# Patient Record
Sex: Male | Born: 1990 | Race: White | Hispanic: No | Marital: Single | Smoking: Never smoker
Health system: Southern US, Community
[De-identification: ages and names within clinical notes are randomized; demographics above are authoritative.]

## PROBLEM LIST (undated history)

## (undated) DIAGNOSIS — K5000 Crohn's disease of small intestine without complications: Secondary | ICD-10-CM

## (undated) DIAGNOSIS — F32A Depression, unspecified: Secondary | ICD-10-CM

## (undated) DIAGNOSIS — F419 Anxiety disorder, unspecified: Secondary | ICD-10-CM

## (undated) DIAGNOSIS — F502 Bulimia nervosa, unspecified: Secondary | ICD-10-CM

## (undated) DIAGNOSIS — F329 Major depressive disorder, single episode, unspecified: Secondary | ICD-10-CM

## (undated) DIAGNOSIS — D696 Thrombocytopenia, unspecified: Secondary | ICD-10-CM

## (undated) HISTORY — PX: COLONOSCOPY: SHX174

## (undated) HISTORY — DX: Bulimia nervosa: F50.2

## (undated) HISTORY — DX: Anxiety disorder, unspecified: F41.9

## (undated) HISTORY — PX: WISDOM TOOTH EXTRACTION: SHX21

## (undated) HISTORY — DX: Depression, unspecified: F32.A

## (undated) HISTORY — DX: Thrombocytopenia, unspecified: D69.6

## (undated) HISTORY — DX: Crohn's disease of small intestine without complications: K50.00

## (undated) HISTORY — DX: Major depressive disorder, single episode, unspecified: F32.9

## (undated) HISTORY — DX: Bulimia nervosa, unspecified: F50.20

---
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
## 2011-12-02 ENCOUNTER — Encounter: Payer: Self-pay | Admitting: Internal Medicine

## 2011-12-05 ENCOUNTER — Encounter: Payer: Self-pay | Admitting: Internal Medicine

## 2011-12-05 ENCOUNTER — Other Ambulatory Visit (INDEPENDENT_AMBULATORY_CARE_PROVIDER_SITE_OTHER): Payer: BC Managed Care – PPO

## 2011-12-05 ENCOUNTER — Ambulatory Visit (INDEPENDENT_AMBULATORY_CARE_PROVIDER_SITE_OTHER): Payer: BC Managed Care – PPO | Admitting: Internal Medicine

## 2011-12-05 VITALS — BP 132/80 | HR 92 | Ht 68.0 in | Wt 175.0 lb

## 2011-12-05 DIAGNOSIS — R188 Other ascites: Secondary | ICD-10-CM

## 2011-12-05 DIAGNOSIS — R197 Diarrhea, unspecified: Secondary | ICD-10-CM

## 2011-12-05 DIAGNOSIS — D72819 Decreased white blood cell count, unspecified: Secondary | ICD-10-CM

## 2011-12-05 DIAGNOSIS — R1033 Periumbilical pain: Secondary | ICD-10-CM

## 2011-12-05 DIAGNOSIS — K921 Melena: Secondary | ICD-10-CM

## 2011-12-05 DIAGNOSIS — L989 Disorder of the skin and subcutaneous tissue, unspecified: Secondary | ICD-10-CM

## 2011-12-05 LAB — SEDIMENTATION RATE: Sed Rate: 0 mm/hr (ref 0–22)

## 2011-12-05 LAB — COMPREHENSIVE METABOLIC PANEL
ALT: 21 U/L (ref 0–53)
AST: 25 U/L (ref 0–37)
Alkaline Phosphatase: 60 U/L (ref 39–117)
BUN: 17 mg/dL (ref 6–23)
Chloride: 100 mEq/L (ref 96–112)
Creatinine, Ser: 1.1 mg/dL (ref 0.4–1.5)

## 2011-12-05 LAB — C-REACTIVE PROTEIN: CRP: <0.5 mg/dL (ref 0.5–20.0)

## 2011-12-05 MED ORDER — NA SULFATE-K SULFATE-MG SULF 17.5-3.13-1.6 GM/177ML PO SOLN: 1.0000 | Freq: Once | ORAL | Status: DC

## 2011-12-05 MED ORDER — DICYCLOMINE HCL 20 MG PO TABS: 20.0000 mg | ORAL_TABLET | Freq: Four times a day (QID) | ORAL | Status: DC | PRN

## 2011-12-05 NOTE — Progress Notes (Signed)
Quick Note:  Let him know the labs look ok except for slight elevation in his bilirubin. Not sure what that means.  His abd/pelvic CT was not normal - had some mild abnormalities of unclear significance - ascites and a dilated blood vessel - IVC  1) Need to see if lab can fractionate the bilirubin 2) I need him to get a copy of CT from [place] and bring the disc to [place] 3) He needs a hematology consult re: leukopenia - within 1-2 weeks I hope (let me know) ______

## 2011-12-05 NOTE — Patient Instructions (Addendum)
Your physician has requested that you go to the basement for the following lab work before leaving today: CRP, Sed Rate,  CMET  We have sent the following medications to your pharmacy for you to pick up at your convenience: Generic Bentyl  You have been scheduled for a colonoscopy with propofol. Please follow written instructions given to you at your visit today.  Please use the suprep kit you have been given today. If you use inhalers (even only as needed), please bring them with you on the day of your procedure.  We have made you an appointment with Dr. Campbell Stall for Oct. 8th at 11:45am, please arrive 15-67mins. early to do paperwork. Her address is [address] , [address].  Phone # [phone]  We will obtain records from [facility] that you signed the ROI for today.  Thank you for choosing me and [facility].  Iva Boop, M.D., [facility]
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
## 2011-12-06 ENCOUNTER — Other Ambulatory Visit: Payer: Self-pay

## 2011-12-06 DIAGNOSIS — R7402 Elevation of levels of lactic acid dehydrogenase (LDH): Secondary | ICD-10-CM

## 2011-12-06 DIAGNOSIS — R7401 Elevation of levels of liver transaminase levels: Secondary | ICD-10-CM

## 2011-12-06 DIAGNOSIS — D72819 Decreased white blood cell count, unspecified: Secondary | ICD-10-CM

## 2011-12-07 ENCOUNTER — Telehealth: Payer: Self-pay | Admitting: Medical Oncology

## 2011-12-07 ENCOUNTER — Telehealth: Payer: Self-pay | Admitting: Internal Medicine

## 2011-12-07 NOTE — Telephone Encounter (Signed)
Grandmother spoke to [place] and pt has been referred by Dr Elenore Paddy? Marland Kitchen Tiffany is waiting for pt to return her call . HE can only come in after 4pm. He was supposed to have colonsocopy on 9/23 but cancelled it due to work.

## 2011-12-07 NOTE — Telephone Encounter (Signed)
I had a voicemail also from the patient.  I have left him a message for call back to discuss all the above at [phone]

## 2011-12-07 NOTE — Telephone Encounter (Signed)
I spoke with the patient he needed to resschedle his colonoscopy.  He is rescheduled for 12/22/11 11:00.  He verbalized understanding of instruction change. He has heard from Oncology they are working on an appt

## 2011-12-13 ENCOUNTER — Other Ambulatory Visit: Payer: Self-pay

## 2011-12-13 ENCOUNTER — Other Ambulatory Visit (INDEPENDENT_AMBULATORY_CARE_PROVIDER_SITE_OTHER): Payer: BC Managed Care – PPO

## 2011-12-13 DIAGNOSIS — R7401 Elevation of levels of liver transaminase levels: Secondary | ICD-10-CM

## 2011-12-13 LAB — BILIRUBIN, TOTAL: Total Bilirubin: 1.6 mg/dL — ABNORMAL HIGH (ref 0.3–1.2)

## 2011-12-14 LAB — BILIRUBIN, TOTAL: Total Bilirubin: 1.2 mg/dL (ref 0.3–1.2)

## 2011-12-14 LAB — BILIRUBIN,DIRECT & INDIRECT (FRACTIONATED): Indirect Bilirubin: 1 mg/dL — ABNORMAL HIGH (ref 0.0–0.9)

## 2011-12-19 ENCOUNTER — Encounter: Payer: BC Managed Care – PPO | Admitting: Internal Medicine

## 2011-12-21 ENCOUNTER — Telehealth: Payer: Self-pay | Admitting: Internal Medicine

## 2011-12-21 NOTE — Telephone Encounter (Signed)
Discussed with Dr. Leone Payor .  He wants him to keep the appt for tomorrow for colonoscopy.  The patient is advised

## 2011-12-21 NOTE — Telephone Encounter (Signed)
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  He notes that they were given antibiotics and their rash went away.  He now reports that his has healed without antibiotics and he no longer has any abdominal pain.  He is advised I will call back once I can speak with Dr. Leone Payor

## 2011-12-22 ENCOUNTER — Encounter: Payer: Self-pay | Admitting: Internal Medicine

## 2011-12-22 ENCOUNTER — Ambulatory Visit (AMBULATORY_SURGERY_CENTER): Payer: BC Managed Care – PPO | Admitting: Internal Medicine

## 2011-12-22 VITALS — BP 122/79 | HR 37 | Temp 96.5°F | Resp 20 | Ht 68.0 in | Wt 175.0 lb

## 2011-12-22 DIAGNOSIS — K633 Ulcer of intestine: Secondary | ICD-10-CM

## 2011-12-22 DIAGNOSIS — K5090 Crohn's disease, unspecified, without complications: Secondary | ICD-10-CM

## 2011-12-22 DIAGNOSIS — K6289 Other specified diseases of anus and rectum: Secondary | ICD-10-CM

## 2011-12-22 DIAGNOSIS — K921 Melena: Secondary | ICD-10-CM

## 2011-12-22 MED ORDER — SODIUM CHLORIDE 0.9 % IV SOLN: 500.0000 mL | INTRAVENOUS | Status: DC

## 2011-12-22 NOTE — Op Note (Signed)
[facility] [address] [place], [address]   COLONOSCOPY PROCEDURE REPORT  PATIENT: Cesar Carlson, Cesar Carlson  MR#: [id number] BIRTHDATE: 1990/04/23 , 21  yrs. old GENDER: Male ENDOSCOPIST: Iva Boop, MD, [facility] REFERRED XB:JYNWG Spear, M.D. PROCEDURE DATE:  12/22/2011 PROCEDURE:   Colonoscopy with biopsy ASA CLASS:   Class I INDICATIONS:hematochezia.   periumbilical abdoinal pain MEDICATIONS: Propofol (Diprivan) 240 mg IV, MAC sedation, administered by CRNA, and These medications were titrated to patient response per physician's verbal order  DESCRIPTION OF PROCEDURE:   After the risks benefits and alternatives of the procedure were thoroughly explained, informed consent was obtained.  A digital rectal exam revealed no abnormalities of the rectum.   The LB [id number]  endoscope was introduced through the anus and advanced to the terminal ileum which was intubated for a short distance. No adverse events experienced.   The quality of the prep was Suprep excellent  The instrument was then slowly withdrawn as the colon was fully examined.      COLON FINDINGS: A small ulcer was found in the terminal ileum.  A biopsy was performed using cold forceps.   Mild erythematous was found in the terminal ileum.  A biopsy was performed using cold forceps.   Moderate erythematous was found in the rectum.  Multiple biopsies were performed using cold forceps.   The colon mucosa was otherwise normal.  Retroflexed views revealed no abnormalities. The time to cecum=2 minutes 22 seconds.  Withdrawal time=10 minutes 01 seconds.  The scope was withdrawn and the procedure completed. COMPLICATIONS: There were no complications.  ENDOSCOPIC IMPRESSION: 1.   Tiny ulcer in the terminal ileum; biopsy was performed using cold forceps 2.   Mild erythematous mucosa was found in the terminal ileum; biopsy was performed using cold forceps 3.   Moderate erythematous mucosa was found in the  rectum; ? proctitis;multiple biopsies were performed using cold forceps 4.   The colon mucosa was otherwise normal with excellent prep  RECOMMENDATIONS: 1.  await biopsy results 2.  Office will call with the results. Subtle findings here could be prep -related. IBD possible but not much change if so.   eSigned:  Iva Boop, MD, [facility] 12/22/2011 11:23 AM   cc: Herb Grays, MD and The Patient

## 2011-12-22 NOTE — Patient Instructions (Addendum)
There were two areas of possible inflammation - one in the end of the small intestine (terminal ileum) and in the rectum. These could also be irritation from the preparation medication. I took biopsies to better understand and will call with the results.  Thank you for choosing me and [facility].  Iva Boop, MD, FACG  YOU HAD AN ENDOSCOPIC PROCEDURE TODAY AT THE [facility]: Refer to the procedure report that was given to you for any specific questions about what was found during the examination.  If the procedure report does not answer your questions, please call your gastroenterologist to clarify.  If you requested that your care partner not be given the details of your procedure findings, then the procedure report has been included in a sealed envelope for you to review at your convenience later.  YOU SHOULD EXPECT: Some feelings of bloating in the abdomen. Passage of more gas than usual.  Walking can help get rid of the air that was put into your GI tract during the procedure and reduce the bloating. If you had a lower endoscopy (such as a colonoscopy or flexible sigmoidoscopy) you may notice spotting of blood in your stool or on the toilet paper. If you underwent a bowel prep for your procedure, then you may not have a normal bowel movement for a few days.  DIET: Your first meal following the procedure should be a light meal and then it is ok to progress to your normal diet.  A half-sandwich or bowl of soup is an example of a good first meal.  Heavy or fried foods are harder to digest and may make you feel nauseous or bloated.  Likewise meals heavy in dairy and vegetables can cause extra gas to form and this can also increase the bloating.  Drink plenty of fluids but you should avoid alcoholic beverages for 24 hours.  ACTIVITY: Your care partner should take you home directly after the procedure.  You should plan to take it easy, moving slowly for the rest of the day.   You can resume normal activity the day after the procedure however you should NOT DRIVE or use heavy machinery for 24 hours (because of the sedation medicines used during the test).    SYMPTOMS TO REPORT IMMEDIATELY: A gastroenterologist can be reached at any hour.  During normal business hours, 8:30 AM to 5:00 PM Monday through Friday, call [phone].  After hours and on weekends, please call the GI answering service at [phone] who will take a message and have the physician on call contact you.   Following lower endoscopy (colonoscopy or flexible sigmoidoscopy):  Excessive amounts of blood in the stool  Significant tenderness or worsening of abdominal pains  Swelling of the abdomen that is new, acute  Fever of 100F or higher  Following upper endoscopy (EGD)  Vomiting of blood or coffee ground material  New chest pain or pain under the shoulder blades  Painful or persistently difficult swallowing  New shortness of breath  Fever of 100F or higher  Black, tarry-looking stools  FOLLOW UP: If any biopsies were taken you will be contacted by phone or by letter within the next 1-3 weeks.  Call your gastroenterologist if you have not heard about the biopsies in 3 weeks.  Our staff will call the home number listed on your records the next business day following your procedure to check on you and address any questions or concerns that you may have at that  time regarding the information given to you following your procedure. This is a courtesy call and so if there is no answer at the home number and we have not heard from you through the emergency physician on call, we will assume that you have returned to your regular daily activities without incident.  SIGNATURES/CONFIDENTIALITY: You and/or your care partner have signed paperwork which will be entered into your electronic medical record.  These signatures attest to the fact that that the information above on your After Visit Summary  has been reviewed and is understood.  Full responsibility of the confidentiality of this discharge information lies with you and/or your care-partner.

## 2011-12-22 NOTE — Progress Notes (Signed)
Patient did not experience any of the following events: a burn prior to discharge; a fall within the facility; wrong site/side/patient/procedure/implant event; or a hospital transfer or hospital admission upon discharge from the facility. (G8907) Patient did not have preoperative order for IV antibiotic SSI prophylaxis. (G8918)  
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
## 2011-12-23 ENCOUNTER — Telehealth: Payer: Self-pay

## 2011-12-23 NOTE — Telephone Encounter (Signed)
Left message on answering machine. 

## 2011-12-30 ENCOUNTER — Other Ambulatory Visit: Payer: Self-pay

## 2011-12-30 DIAGNOSIS — D696 Thrombocytopenia, unspecified: Secondary | ICD-10-CM

## 2012-01-03 ENCOUNTER — Ambulatory Visit: Payer: Self-pay | Admitting: Internal Medicine

## 2012-01-30 ENCOUNTER — Ambulatory Visit: Payer: BC Managed Care – PPO | Admitting: Internal Medicine

## 2012-12-28 ENCOUNTER — Ambulatory Visit (HOSPITAL_COMMUNITY)
Admission: RE | Admit: 2012-12-28 | Discharge: 2012-12-28 | Disposition: A | Discharge status: Home / Self Care | Payer: BC Managed Care – PPO | Source: Ambulatory Visit | Attending: *Deleted | Admitting: *Deleted

## 2012-12-28 ENCOUNTER — Encounter (HOSPITAL_COMMUNITY): Payer: Self-pay

## 2012-12-28 ENCOUNTER — Other Ambulatory Visit (HOSPITAL_COMMUNITY): Payer: Self-pay | Admitting: *Deleted

## 2012-12-28 DIAGNOSIS — R1011 Right upper quadrant pain: Secondary | ICD-10-CM | POA: Insufficient documentation

## 2012-12-28 MED ORDER — IOHEXOL 300 MG/ML  SOLN
100.0000 mL | Freq: Once | INTRAMUSCULAR | Status: AC | PRN
  Administered 2012-12-28: 100 mL via INTRAVENOUS
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
## 2015-04-15 ENCOUNTER — Other Ambulatory Visit: Payer: Self-pay | Admitting: Otolaryngology

## 2015-04-29 ENCOUNTER — Ambulatory Visit (HOSPITAL_COMMUNITY): Payer: Self-pay | Admitting: Psychiatry

## 2016-03-07 ENCOUNTER — Encounter: Payer: Self-pay | Admitting: Physician Assistant

## 2016-03-07 ENCOUNTER — Telehealth: Payer: Self-pay | Admitting: Physician Assistant

## 2016-03-07 ENCOUNTER — Other Ambulatory Visit (INDEPENDENT_AMBULATORY_CARE_PROVIDER_SITE_OTHER): Payer: PRIVATE HEALTH INSURANCE

## 2016-03-07 ENCOUNTER — Ambulatory Visit (INDEPENDENT_AMBULATORY_CARE_PROVIDER_SITE_OTHER): Payer: PRIVATE HEALTH INSURANCE | Admitting: Physician Assistant

## 2016-03-07 VITALS — BP 140/88 | HR 68 | Ht 68.0 in | Wt 186.0 lb

## 2016-03-07 DIAGNOSIS — R197 Diarrhea, unspecified: Secondary | ICD-10-CM | POA: Diagnosis not present

## 2016-03-07 DIAGNOSIS — K50011 Crohn's disease of small intestine with rectal bleeding: Secondary | ICD-10-CM

## 2016-03-07 DIAGNOSIS — R1084 Generalized abdominal pain: Secondary | ICD-10-CM | POA: Diagnosis not present

## 2016-03-07 DIAGNOSIS — K625 Hemorrhage of anus and rectum: Secondary | ICD-10-CM

## 2016-03-07 LAB — CBC WITH DIFFERENTIAL/PLATELET
BASOS PCT: 0.6 % (ref 0.0–3.0)
Basophils Absolute: 0 10*3/uL (ref 0.0–0.1)
EOS PCT: 5.2 % — AB (ref 0.0–5.0)
Eosinophils Absolute: 0.2 10*3/uL (ref 0.0–0.7)
HCT: 46.3 % (ref 39.0–52.0)
Hemoglobin: 15.6 g/dL (ref 13.0–17.0)
LYMPHS ABS: 1.7 10*3/uL (ref 0.7–4.0)
Lymphocytes Relative: 45 % (ref 12.0–46.0)
MCHC: 33.8 g/dL (ref 30.0–36.0)
MCV: 84.7 fl (ref 78.0–100.0)
MONOS PCT: 7.3 % (ref 3.0–12.0)
Monocytes Absolute: 0.3 10*3/uL (ref 0.1–1.0)
NEUTROS ABS: 1.6 10*3/uL (ref 1.4–7.7)
Neutrophils Relative %: 41.9 % — ABNORMAL LOW (ref 43.0–77.0)
PLATELETS: 131 10*3/uL — AB (ref 150.0–400.0)
RBC: 5.46 Mil/uL (ref 4.22–5.81)
RDW: 14.1 % (ref 11.5–15.5)
WBC: 3.8 10*3/uL — ABNORMAL LOW (ref 4.0–10.5)

## 2016-03-07 LAB — COMPREHENSIVE METABOLIC PANEL
ALK PHOS: 48 U/L (ref 39–117)
ALT: 17 U/L (ref 0–53)
AST: 17 U/L (ref 0–37)
Albumin: 4.5 g/dL (ref 3.5–5.2)
BUN: 13 mg/dL (ref 6–23)
CO2: 27 mEq/L (ref 19–32)
Calcium: 9.7 mg/dL (ref 8.4–10.5)
Chloride: 102 mEq/L (ref 96–112)
Creatinine, Ser: 1 mg/dL (ref 0.40–1.50)
GFR: 95 mL/min (ref >60.00)
GLUCOSE: 100 mg/dL — AB (ref 70–99)
POTASSIUM: 4.7 meq/L (ref 3.5–5.1)
Sodium: 137 mEq/L (ref 135–145)
TOTAL PROTEIN: 7.1 g/dL (ref 6.0–8.3)
Total Bilirubin: 1.2 mg/dL (ref 0.2–1.2)

## 2016-03-07 LAB — VITAMIN B12: VITAMIN B 12: 324 pg/mL (ref 211–911)

## 2016-03-07 LAB — FOLATE: FOLATE: 8.6 ng/mL (ref >5.9)

## 2016-03-07 LAB — HIGH SENSITIVITY CRP: CRP, High Sensitivity: 0.15 mg/L (ref 0.000–5.000)

## 2016-03-07 LAB — SEDIMENTATION RATE: Sed Rate: 1 mm/hr (ref 0–15)

## 2016-03-07 MED ORDER — BUDESONIDE 3 MG PO CPEP: ORAL_CAPSULE | ORAL | 2 refills | Status: DC

## 2016-03-07 MED ORDER — GLYCOPYRROLATE 2 MG PO TABS: 2.0000 mg | ORAL_TABLET | Freq: Two times a day (BID) | ORAL | 6 refills | Status: DC

## 2016-03-07 NOTE — Telephone Encounter (Signed)
[facility] [address] Asked them if I could do a prior authorization for the Budesonide ( Entocort) . The pharmacist said no prior authorization is needed. The price the patient has to pay after insurance filed is $1000.00.  Per Mike GipAmy Esterwood PA, she asked me to asked the patient if he could afford 14 days of the Robinul Forte 2 mg which would be $35.00.  He said he could pick that up at the pharmacy.  Amy is talking to Dr. Leone PayorGessner about possibly the patient taking Prednisone.  We will get back to the patient .

## 2016-03-07 NOTE — Progress Notes (Signed)
Subjective:    Patient ID: Cesar Carlson, male    DOB: 10/25/1990, 25 y.o.   MRN: [id number]  HPI Cesar Carlson is a pleasant [age] year old male previously known to Dr. Leone PayorGessner who is referred today by Dr. Thornton PapasJeffrey Anderson. Patient was last seen here in September 2013 at which time he had undergone colonoscopy for complaints of mid abdominal pain. He was found to have tiny ulcers and erythema in the terminal ileum and moderate erythema in the rectum. Biopsies of the TI showed an active ileitis with granulomas, consistent with Crohn's disease and rectal biopsies were benign. Patient did not come back in for follow-up and has not been seen in our office since. Comes in today stating that he had lost his insurance for a few years and has been having some ongoing mild symptoms. Feels that perhaps his abdominal discomfort has been somewhat worse over the past 6 months. He says he usually always has diarrhea and rarely has a formed bowel movement. Generally having 3-4 bowel movements per day and see small amounts of bright red blood mixed with his bowel movements intermittently. He says he has to get up early in the morning because he needs at least an hour in the bathroom prior to going to work and may have abdominal cramping and several bowel movements. Appendectomy says he always feels best with his system decompressed. Appetite Has Been Fine and Weight Has Been Stable. Scrubs Intermittent Lower Abdominal Pain and Cramping, No Fever or Chills, No Arthralgias. He Also Mentions That He Has a Diagnosis of Bulimia and Depression. He Had Been Seen Counselor but Is Not Currently Seeing Anyone. He Says Most of the Time He Has the Bulimia under Control but Occasionally Will Have "Episodes."  Review of Systems Pertinent positive and negative review of systems were noted in the above HPI section.  All other review of systems was otherwise negative.   Outpatient Encounter Prescriptions as of 03/07/2016  Medication Sig  .  budesonide (ENTOCORT EC) 3 MG 24 hr capsule Take 3 pills each morning.  Marland Kitchen. glycopyrrolate (ROBINUL) 2 MG tablet Take 1 tablet (2 mg total) by mouth 2 (two) times daily.  . [DISCONTINUED] dicyclomine (BENTYL) 20 MG tablet Take 1 tablet (20 mg total) by mouth every 6 (six) hours as needed (abdominal pain).   No facility-administered encounter medications on file as of 03/07/2016.    No Known Allergies Patient Active Problem List   Diagnosis Date Noted  . Leukopenia 12/05/2011  . Blood in stool 12/05/2011  . Periumbilical abdominal pain 12/05/2011  . Skin lesions, generalized 12/05/2011   Social History   Social History  . Marital status: Single    Spouse name: N/A  . Number of children: 0  . Years of education: N/A   Occupational History  . Location managerMachine Operator     Social History Main Topics  . Smoking status: Never Smoker  . Smokeless tobacco: Never Used  . Alcohol use Yes     Comment: rare  . Drug use: No  . Sexual activity: Not on file   Other Topics Concern  . Not on file   Social History Narrative  . No narrative on file    Cesar Carlson's family history includes Colon cancer in his maternal grandfather.      Objective:    Vitals:   03/07/16 1046  BP: 140/88  Pulse: 68    Physical Exam  well-developed young male in no acute distress, pleasant blood pressure 140/88 pulse 68, height 5  foot 8, weight 186 BMI 28.2. HEENT ;nontraumatic normocephalic EOMI PERRLA sclera anicteric, Cardiovascular; regular rate and rhythm with S1-S2 no murmur or gallop, Pulmonary ;clear bilaterally, Abdomen ;soft and some mild tenderness in the right lower quadrant there is no guarding or rebound no palpable mass or hepatosplenomegaly bowel sounds are present Rectal ;exam not done, Extremities ;no clubbing cyanosis or edema skin warm and dry, Neuropsych; mood and affect appropriate       Assessment & Plan:   #291 25 year old male with Crohn's ileitis diagnosed September 2013, no medical  therapy since diagnosis now presenting with ongoing intermittent abdominal pain and cramping, moderate diarrhea and intermittent hematochezia. Symptoms are consistent with active Crohn's disease #2 history of bulimia  #3 history of depression  Plan; Will obtain baseline labs, to include CBC, CMET,sedimentation rate CRP Start Entocort 3 mg, 3 tablets by mouth every morning Start Robinul Forte 2 mg by mouth twice a day Patient inquires about a probiotic and Culturelle is suggested 1-2 daily He'll follow-up with Dr. Leone PayorGessner in one month, and depending on response to above can decide on indication for further imaging and/or repeat colonoscopy.   Amy S Esterwood PA-C 03/07/2016   Cc: No ref. provider found

## 2016-03-07 NOTE — Patient Instructions (Addendum)
Please go to the basement level to have your labs drawn.   [facility] [address] 1. Entocort 3 mg 2. Robinul Forte 2 mg  Try Culturelle probiotic, over the counter. Take 1 twice daily.   Call us for any problems.   We made you an appointment with Dr. Stan Headarl Gessner for 04-11-2016 at 3:00 PM.

## 2016-03-08 ENCOUNTER — Other Ambulatory Visit: Payer: Self-pay

## 2016-03-08 MED ORDER — PREDNISONE 10 MG PO TABS: 20.0000 mg | ORAL_TABLET | Freq: Every day | ORAL | 0 refills | Status: DC

## 2016-03-08 NOTE — Progress Notes (Signed)
Agree with Ms. Oswald Hillocksterwood's assessment and plan. Iva Booparl E. Orma Cheetham, MD, [facility]   We will have to use prednisone at this time due to cost issues for entocort. Will need to think about biologic Tx vs trial of ASA would need to reassess small bowel first I think

## 2016-03-08 NOTE — Telephone Encounter (Signed)
-----   Message from Sammuel CooperAmy S Esterwood, PA-C sent at 03/08/2016 11:46 AM EST ----- Regarding: RE: Mardene SpeakMiles  Farnan Sounds good.[name].  Beth , please call pt and let him know that we cannot get Entocort preauthorized for him... Will start Predisone instead - 20 mg po qam x 2 weeks , then decreased to 15 mg daily x 2 weeks ,then decrease to 10 mg daily and stay at that dose until sees dr Leone PayorGessner for follow up appt   ----- Message ----- From: Iva Booparl E Gessner, MD Sent: 03/08/2016   8:31 AM To: Sammuel CooperAmy S Esterwood, PA-C Subject: RE: [name]                                Sure Taper down to 10 mg and stay til he sees me  Sound good? ----- Message ----- From: Sammuel CooperAmy S Esterwood, PA-C Sent: 03/07/2016   5:41 PM To: Iva Booparl E Gessner, MD Subject: Mardene SpeakMiles  Minor                                    Please review my note from today- his insurance will not cover Entocort well -his cost will be 1000.00 --- are you ok putting him on some prednisone?

## 2016-03-08 NOTE — Telephone Encounter (Signed)
Left a message for the patient to call me. [facility]

## 2016-03-09 NOTE — Telephone Encounter (Signed)
Patient is advised.  

## 2016-04-11 ENCOUNTER — Telehealth: Payer: Self-pay | Admitting: Internal Medicine

## 2016-04-11 ENCOUNTER — Ambulatory Visit: Payer: Self-pay | Admitting: Internal Medicine

## 2016-05-19 ENCOUNTER — Ambulatory Visit: Payer: Self-pay | Admitting: Internal Medicine

## 2016-06-09 ENCOUNTER — Other Ambulatory Visit (INDEPENDENT_AMBULATORY_CARE_PROVIDER_SITE_OTHER): Payer: PRIVATE HEALTH INSURANCE

## 2016-06-09 ENCOUNTER — Ambulatory Visit (INDEPENDENT_AMBULATORY_CARE_PROVIDER_SITE_OTHER): Payer: PRIVATE HEALTH INSURANCE | Admitting: Physician Assistant

## 2016-06-09 ENCOUNTER — Encounter: Payer: Self-pay | Admitting: Physician Assistant

## 2016-06-09 ENCOUNTER — Encounter (INDEPENDENT_AMBULATORY_CARE_PROVIDER_SITE_OTHER): Payer: Self-pay

## 2016-06-09 VITALS — BP 120/74 | HR 64 | Ht 68.0 in | Wt 186.2 lb

## 2016-06-09 DIAGNOSIS — K529 Noninfective gastroenteritis and colitis, unspecified: Secondary | ICD-10-CM

## 2016-06-09 DIAGNOSIS — K50018 Crohn's disease of small intestine with other complication: Secondary | ICD-10-CM | POA: Diagnosis not present

## 2016-06-09 LAB — C-REACTIVE PROTEIN: CRP: 0.1 mg/dL — AB (ref 0.5–20.0)

## 2016-06-09 LAB — CBC WITH DIFFERENTIAL/PLATELET
BASOS PCT: 1.5 % (ref 0.0–3.0)
Basophils Absolute: 0 10*3/uL (ref 0.0–0.1)
EOS ABS: 0.2 10*3/uL (ref 0.0–0.7)
EOS PCT: 8.4 % — AB (ref 0.0–5.0)
HCT: 47.4 % (ref 39.0–52.0)
HEMOGLOBIN: 15.6 g/dL (ref 13.0–17.0)
LYMPHS ABS: 1.5 10*3/uL (ref 0.7–4.0)
Lymphocytes Relative: 54.1 % — ABNORMAL HIGH (ref 12.0–46.0)
MCHC: 32.9 g/dL (ref 30.0–36.0)
MCV: 86.3 fl (ref 78.0–100.0)
MONO ABS: 0.3 10*3/uL (ref 0.1–1.0)
Monocytes Relative: 11.6 % (ref 3.0–12.0)
NEUTROS ABS: 0.7 10*3/uL — AB (ref 1.4–7.7)
Neutrophils Relative %: 24.4 % — ABNORMAL LOW (ref 43.0–77.0)
PLATELETS: 134 10*3/uL — AB (ref 150.0–400.0)
RBC: 5.49 Mil/uL (ref 4.22–5.81)
RDW: 14 % (ref 11.5–15.5)
WBC: 2.8 10*3/uL — AB (ref 4.0–10.5)

## 2016-06-09 LAB — MAGNESIUM: Magnesium: 2.2 mg/dL (ref 1.5–2.5)

## 2016-06-09 LAB — VITAMIN B12: Vitamin B-12: 338 pg/mL (ref 211–911)

## 2016-06-09 LAB — PHOSPHORUS: Phosphorus: 3.6 mg/dL (ref 2.3–4.6)

## 2016-06-09 MED ORDER — MESALAMINE ER 500 MG PO CPCR: 1000.0000 mg | ORAL_CAPSULE | Freq: Three times a day (TID) | ORAL | 6 refills | Status: DC

## 2016-06-09 NOTE — Patient Instructions (Addendum)
Your physician has requested that you go to the basement for the lab work before leaving today.   We have sent the following medications to your pharmacy for you to pick up at your convenience: Pentasa   The nutrition department will contact you about an appointment.    Please keep the April appointment with Dr Leone PayorGessner. (07-12-16 at 4:00pm)    I appreciate the opportunity to care for you.

## 2016-06-09 NOTE — Progress Notes (Addendum)
Subjective:    Patient ID: Cesar Carlson, male    DOB: 11/23/1990, 26 y.o.   MRN: [id number]  HPI Cesar Carlson is a 26 year old male, known to Dr. Leone Carlson who has diagnosis of Crohn's ileitis which was diagnosed in 2013. He had not been seen in our office for several years and repeat presented in December 2017 with complaints of abdominal cramping and chronic diarrhea. It was felt his symptoms were consistent with active Crohn's and he was started on Entocort 9 mg daily and Robinul forte. Labs were done which were unremarkable. Unfortunately his insurance would not cover Entocort and he was then given a tapered course of prednisone starting at 20 mg daily 2 weeks and then tapering every 2 weeks until done. He was to follow-up with Dr. Leone Carlson but canceled that appointment. He comes in today stating that she did not feel that the prednisone really made any difference in his symptoms. He was unable to tolerate the Robinul Forte because of dry mouth and some dizziness. He feels about the same, no worse. He has chronic diarrhea with 3-4 bowel movements per day, states he occasionally sees a small amount of blood in his stool. He has intermittent abdominal cramping. He says that he's been having more muscle cramping after his workouts recently and also has had an increase in migraines. He is wondering about vitamin deficiencies. He is competitive in Cytogeneticistmartial arts, denies any megadose vitamins supplements etc.  Review of Systems Pertinent positive and negative review of systems were noted in the above HPI section.  All other review of systems was otherwise negative.  Outpatient Encounter Prescriptions as of 06/09/2016  Medication Sig  . glycopyrrolate (ROBINUL) 2 MG tablet Take 1 tablet (2 mg total) by mouth 2 (two) times daily. (Patient not taking: Reported on 06/09/2016)  . mesalamine (PENTASA) 500 MG CR capsule Take 2 capsules (1,000 mg total) by mouth 3 (three) times daily.  . [DISCONTINUED] predniSONE  (DELTASONE) 10 MG tablet Take 2 tablets (20 mg total) by mouth daily with breakfast. And as directed by your GI doctor   No facility-administered encounter medications on file as of 06/09/2016.    No Known Allergies Patient Active Problem List   Diagnosis Date Noted  . Leukopenia 12/05/2011  . Blood in stool 12/05/2011  . Periumbilical abdominal pain 12/05/2011  . Skin lesions, generalized 12/05/2011   Social History   Social History  . Marital status: Single    Spouse name: N/A  . Number of children: 0  . Years of education: N/A   Occupational History  . Location managerMachine Operator     Social History Main Topics  . Smoking status: Never Smoker  . Smokeless tobacco: Never Used  . Alcohol use Yes     Comment: rare  . Drug use: No  . Sexual activity: Not on file   Other Topics Concern  . Not on file   Social History Narrative  . No narrative on file    Mr. Cesar HarperBell's family history includes Colon cancer in his maternal grandfather.      Objective:    Vitals:   06/09/16 1313  BP: 120/74  Pulse: 64    Physical Exam  well-developed young male in no acute distress, pressure 120/74 pulse 64, height 5 foot 8, weight 186, BMI 28.3. HEENT; nontraumatic, cephalic EOMI PERRLA sclera anicteric, Cardiovascular; regular rate and rhythm with S1-S2 no murmur or gallop, Pulmonary; clear bilaterally, Abdomen; soft nontender nondistended bowel sounds are active there is no palpable mass  or hepatosplenomegaly, Rectal; exam not done, Ext; no clubbing cyanosis or edema skin warm and dry, Neuropsych; mood and affect appropriate       Assessment & Plan:   #30 26 year old male with history of Crohn's ileitis documented on colonoscopy 2013. Ongoing mild symptoms with chronic diarrhea 3-4 times daily intermittent abdominal cramping occasional small-volume hematochezia. Minimal if any response to recent course of prednisone 20 mg daily. Unable to get Entocort authorized. Intolerant to Robinul  Forte  #2 history of bulimia  Plan; CBC with differential, sedimentation rate, CRP, B12 level magnesium Give him a trial of Pentasa 500 mg 2 by mouth 3 times a day. I suspect  He is  going to wind up needing another colonoscopy soon to redocument extent of disease and then decide about initiating biologic therapy. His symptoms are mild to moderate and have not progressed. We'll have him follow-up with Dr. Leone Payor in 3-4 weeks.  Cesar Sitter Oswald Hillock PA-C 06/09/2016   Agree with Ms. Oswald Carlson assessment and plan. Cesar Boop, MD, Cesar Carlson

## 2016-06-14 NOTE — Progress Notes (Signed)
Neutropenia may be due to race - is seen in blacks but not aware that tpenia is so I would have him see a hematologist to look over things

## 2016-06-15 ENCOUNTER — Other Ambulatory Visit: Payer: Self-pay

## 2016-06-15 DIAGNOSIS — D708 Other neutropenia: Secondary | ICD-10-CM

## 2016-06-30 ENCOUNTER — Encounter: Payer: Self-pay | Admitting: Hematology

## 2016-06-30 ENCOUNTER — Telehealth: Payer: Self-pay | Admitting: Hematology

## 2016-06-30 NOTE — Telephone Encounter (Signed)
Tc to the pt to schedule a hem appt. Appt has been scheduled for the pt to see Dr. Candise Che on 5/17 at 3pm. Pt stated that he needed the latest appt time. Demographics verified. Letter mailed to the pt.

## 2016-07-04 ENCOUNTER — Telehealth: Payer: Self-pay

## 2016-07-04 ENCOUNTER — Telehealth: Payer: Self-pay | Admitting: Internal Medicine

## 2016-07-04 MED ORDER — MESALAMINE ER 500 MG PO CPCR: 1000.0000 mg | ORAL_CAPSULE | Freq: Three times a day (TID) | ORAL | 3 refills | Status: AC

## 2016-07-04 MED ORDER — GLYCOPYRROLATE 2 MG PO TABS: 2.0000 mg | ORAL_TABLET | Freq: Two times a day (BID) | ORAL | 3 refills | Status: DC

## 2016-07-04 NOTE — Telephone Encounter (Signed)
Filled out [employer] assistance form and mailed it along with printed rx's for both the pentasa and glycopyrrolate to : [employer] [address], [place] , [place] [address].  Form sent to be scanned into epic.

## 2016-07-04 NOTE — Telephone Encounter (Signed)
A user error has taken place.

## 2016-07-05 ENCOUNTER — Ambulatory Visit: Payer: PRIVATE HEALTH INSURANCE | Admitting: Internal Medicine

## 2016-07-12 ENCOUNTER — Ambulatory Visit: Payer: PRIVATE HEALTH INSURANCE | Admitting: Internal Medicine

## 2016-08-08 ENCOUNTER — Ambulatory Visit: Payer: PRIVATE HEALTH INSURANCE | Admitting: Internal Medicine

## 2016-08-09 ENCOUNTER — Telehealth: Payer: Self-pay | Admitting: *Deleted

## 2016-08-09 NOTE — Telephone Encounter (Signed)
Patient called to cancel appointment with no reschedule. Appt cancelled.

## 2016-08-11 ENCOUNTER — Encounter: Payer: PRIVATE HEALTH INSURANCE | Admitting: Hematology

## 2016-08-19 ENCOUNTER — Encounter (INDEPENDENT_AMBULATORY_CARE_PROVIDER_SITE_OTHER): Payer: Self-pay

## 2016-08-19 ENCOUNTER — Encounter: Payer: Self-pay | Admitting: Internal Medicine

## 2016-08-19 ENCOUNTER — Ambulatory Visit (INDEPENDENT_AMBULATORY_CARE_PROVIDER_SITE_OTHER): Payer: PRIVATE HEALTH INSURANCE | Admitting: Internal Medicine

## 2016-08-19 ENCOUNTER — Other Ambulatory Visit (INDEPENDENT_AMBULATORY_CARE_PROVIDER_SITE_OTHER): Payer: PRIVATE HEALTH INSURANCE

## 2016-08-19 VITALS — BP 112/60 | HR 64 | Ht 67.32 in | Wt 187.4 lb

## 2016-08-19 DIAGNOSIS — D696 Thrombocytopenia, unspecified: Secondary | ICD-10-CM

## 2016-08-19 DIAGNOSIS — D709 Neutropenia, unspecified: Secondary | ICD-10-CM | POA: Diagnosis not present

## 2016-08-19 DIAGNOSIS — K50011 Crohn's disease of small intestine with rectal bleeding: Secondary | ICD-10-CM | POA: Diagnosis not present

## 2016-08-19 LAB — CBC WITH DIFFERENTIAL/PLATELET
BASOS PCT: 1.2 % (ref 0.0–3.0)
Basophils Absolute: 0 10*3/uL (ref 0.0–0.1)
EOS ABS: 0.3 10*3/uL (ref 0.0–0.7)
Eosinophils Relative: 10.2 % — ABNORMAL HIGH (ref 0.0–5.0)
HEMATOCRIT: 44.8 % (ref 39.0–52.0)
HEMOGLOBIN: 14.8 g/dL (ref 13.0–17.0)
LYMPHS PCT: 59.5 % — AB (ref 12.0–46.0)
Lymphs Abs: 1.8 10*3/uL (ref 0.7–4.0)
MCHC: 33 g/dL (ref 30.0–36.0)
MCV: 85.5 fl (ref 78.0–100.0)
MONOS PCT: 10.6 % (ref 3.0–12.0)
Monocytes Absolute: 0.3 10*3/uL (ref 0.1–1.0)
NEUTROS ABS: 0.6 10*3/uL — AB (ref 1.4–7.7)
Neutrophils Relative %: 18.5 % — ABNORMAL LOW (ref 43.0–77.0)
PLATELETS: 131 10*3/uL — AB (ref 150.0–400.0)
RBC: 5.23 Mil/uL (ref 4.22–5.81)
RDW: 14 % (ref 11.5–15.5)
WBC: 3 10*3/uL — AB (ref 4.0–10.5)

## 2016-08-19 MED ORDER — DICYCLOMINE HCL 20 MG PO TABS: 20.0000 mg | ORAL_TABLET | Freq: Three times a day (TID) | ORAL | 3 refills | Status: DC

## 2016-08-19 NOTE — Progress Notes (Signed)
   Cesar Carlson 25 y.o. Aug 22, 1990 [id number]  Assessment & Plan:   Encounter Diagnoses  Name Primary?  . Crohn's disease of small intestine with rectal bleeding (HCC) Yes  . Neutropenia, unspecified type (HCC)   . Thrombocytopenia (HCC)      CBC, Pathologist review smear to evaluate neutropenia and thrombocytopenia Stay on mesalamine 2 mo f/u Low fiber diet for symptom relief Dicyclomine 20 mg 3 times a day before meals and at bedtime instead of glycopyrrolate  If he fails to significantly improved could need other imaging like enterography versus capsule endoscopy. Could consider repeating a colonoscopy though might go with a capsule endoscope instead. Will also consider budesonide.  Subjective:   Chief Complaint: Diarrhea  HPI Cesar Carlson is here for follow-up after reestablishing care in December 2017.Marland Kitchen. Long absence without follow-up, he had Crohn's ileitis diagnosed at colonoscopy in 2013. He was started on Pentasa at 1000 mg 3 times a day but he is only been on that about a month because he had to get assistance because of the cost. He essentially says his about the same as he was when he presented in March. Does not seem to be having nocturnal symptoms. He had thrombocytopenia and leukopenia which were mild and was referred to hematology but did not go  Medications, allergies, past medical history, past surgical history, family history and social history are reviewed and updated in the EMR.  Review of Systems As above  Objective:   Physical Exam BP 112/60 (BP Location: Left Arm, Patient Position: Sitting, Cuff Size: Normal)   Pulse 64   Ht 5' 7.32" (1.71 m)   Wt 187 lb 6 oz (85 kg)   BMI 29.07 kg/m  Eyes are anicteric Abdomen is soft nontender no mass  15 minutes time spent with patient > half in counseling coordination of care

## 2016-08-19 NOTE — Patient Instructions (Addendum)
If you are age 765 or older, your body mass index should be between 23-30. Your Body mass index is 29.07 kg/m. If this is out of the aforementioned range listed, please consider follow up with your Primary Care Provider.  If you are age 28 or younger, your body mass index should be between 19-25. Your Body mass index is 29.07 kg/m. If this is out of the aformentioned range listed, please consider follow up with your Primary Care Provider.   Your physician has requested that you go to the basement for lab work before leaving today.  We have sent the following medications to your pharmacy for you to pick up at your convenience:  Dicyclomine (try [facility] for cheaper price).  You have been given a handout of Low Fiber, Low residue diet.  Please follow up with Dr. Leone PayorGessner on July 20th at 3:45pm.

## 2016-08-24 ENCOUNTER — Other Ambulatory Visit: Payer: Self-pay

## 2016-08-24 DIAGNOSIS — D708 Other neutropenia: Secondary | ICD-10-CM

## 2016-08-24 DIAGNOSIS — D696 Thrombocytopenia, unspecified: Secondary | ICD-10-CM

## 2016-08-24 NOTE — Progress Notes (Signed)
Please explain that he has persistent low platelets and wbc  This really should be evaluated by hematology - Jess had referred but he did not go  Please ask him if there are specific reasons he does not want to do this and I will address but I think very important to do this

## 2016-09-01 ENCOUNTER — Telehealth: Payer: Self-pay | Admitting: Oncology

## 2016-09-01 ENCOUNTER — Encounter: Payer: Self-pay | Admitting: Oncology

## 2016-09-01 NOTE — Telephone Encounter (Signed)
Appt has been scheduled for the pt to see Dr. Clelia CroftShadad on 6/29 at 2pm. Pt aware to arrive 30 minutes early. Letter mailed.

## 2016-09-23 ENCOUNTER — Encounter: Payer: PRIVATE HEALTH INSURANCE | Admitting: Oncology

## 2016-10-14 ENCOUNTER — Ambulatory Visit: Payer: PRIVATE HEALTH INSURANCE | Admitting: Internal Medicine

## 2023-06-30 ENCOUNTER — Emergency Department (HOSPITAL_BASED_OUTPATIENT_CLINIC_OR_DEPARTMENT_OTHER)
Admission: EM | Admit: 2023-06-30 | Discharge: 2023-06-30 | Disposition: A | Discharge status: Home / Self Care | Attending: Emergency Medicine | Admitting: Emergency Medicine

## 2023-06-30 ENCOUNTER — Emergency Department (HOSPITAL_BASED_OUTPATIENT_CLINIC_OR_DEPARTMENT_OTHER)

## 2023-06-30 ENCOUNTER — Encounter (HOSPITAL_BASED_OUTPATIENT_CLINIC_OR_DEPARTMENT_OTHER): Payer: Self-pay

## 2023-06-30 ENCOUNTER — Other Ambulatory Visit: Payer: Self-pay

## 2023-06-30 DIAGNOSIS — R1032 Left lower quadrant pain: Secondary | ICD-10-CM | POA: Diagnosis not present

## 2023-06-30 DIAGNOSIS — D696 Thrombocytopenia, unspecified: Secondary | ICD-10-CM | POA: Diagnosis not present

## 2023-06-30 DIAGNOSIS — D72819 Decreased white blood cell count, unspecified: Secondary | ICD-10-CM | POA: Diagnosis not present

## 2023-06-30 DIAGNOSIS — R519 Headache, unspecified: Secondary | ICD-10-CM | POA: Diagnosis not present

## 2023-06-30 DIAGNOSIS — R197 Diarrhea, unspecified: Secondary | ICD-10-CM | POA: Insufficient documentation

## 2023-06-30 LAB — C DIFFICILE QUICK SCREEN W PCR REFLEX
C Diff antigen: NEGATIVE
C Diff interpretation: NOT DETECTED
C Diff toxin: NEGATIVE

## 2023-06-30 LAB — CBC WITH DIFFERENTIAL/PLATELET
Abs Immature Granulocytes: 0.01 10*3/uL (ref 0.00–0.07)
Basophils Absolute: 0 10*3/uL (ref 0.0–0.1)
Basophils Relative: 1 %
Eosinophils Absolute: 0.3 10*3/uL (ref 0.0–0.5)
Eosinophils Relative: 9 %
HCT: 45.7 % (ref 39.0–52.0)
Hemoglobin: 15.2 g/dL (ref 13.0–17.0)
Immature Granulocytes: 0 %
Lymphocytes Relative: 49 %
Lymphs Abs: 1.4 10*3/uL (ref 0.7–4.0)
MCH: 28.3 pg (ref 26.0–34.0)
MCHC: 33.3 g/dL (ref 30.0–36.0)
MCV: 84.9 fL (ref 80.0–100.0)
Monocytes Absolute: 0.3 10*3/uL (ref 0.1–1.0)
Monocytes Relative: 10 %
Neutro Abs: 0.9 10*3/uL — ABNORMAL LOW (ref 1.7–7.7)
Neutrophils Relative %: 31 %
Platelets: 122 10*3/uL — ABNORMAL LOW (ref 150–400)
RBC: 5.38 MIL/uL (ref 4.22–5.81)
RDW: 13.6 % (ref 11.5–15.5)
WBC: 2.9 10*3/uL — ABNORMAL LOW (ref 4.0–10.5)
nRBC: 0 % (ref 0.0–0.2)

## 2023-06-30 LAB — URINALYSIS, ROUTINE W REFLEX MICROSCOPIC
Bilirubin Urine: NEGATIVE
Glucose, UA: NEGATIVE mg/dL
Hgb urine dipstick: NEGATIVE
Ketones, ur: NEGATIVE mg/dL
Leukocytes,Ua: NEGATIVE
Nitrite: NEGATIVE
Protein, ur: NEGATIVE mg/dL
Specific Gravity, Urine: 1.024 (ref 1.005–1.030)
pH: 6 (ref 5.0–8.0)

## 2023-06-30 LAB — COMPREHENSIVE METABOLIC PANEL WITH GFR
ALT: 17 U/L (ref 0–44)
AST: 20 U/L (ref 15–41)
Albumin: 4.4 g/dL (ref 3.5–5.0)
Alkaline Phosphatase: 41 U/L (ref 38–126)
Anion gap: 7 (ref 5–15)
BUN: 19 mg/dL (ref 6–20)
CO2: 27 mmol/L (ref 22–32)
Calcium: 9.4 mg/dL (ref 8.9–10.3)
Chloride: 103 mmol/L (ref 98–111)
Creatinine, Ser: 1.27 mg/dL — ABNORMAL HIGH (ref 0.61–1.24)
GFR, Estimated: >60 mL/min (ref >60)
Glucose, Bld: 93 mg/dL (ref 70–99)
Potassium: 3.7 mmol/L (ref 3.5–5.1)
Sodium: 137 mmol/L (ref 135–145)
Total Bilirubin: 0.7 mg/dL (ref 0.0–1.2)
Total Protein: 6.9 g/dL (ref 6.5–8.1)

## 2023-06-30 LAB — LIPASE, BLOOD: Lipase: 21 U/L (ref 11–51)

## 2023-06-30 MED ORDER — DICYCLOMINE HCL 20 MG PO TABS: 20.0000 mg | ORAL_TABLET | Freq: Three times a day (TID) | ORAL | 0 refills | Status: DC

## 2023-06-30 MED ORDER — DICYCLOMINE HCL 20 MG PO TABS: 20.0000 mg | ORAL_TABLET | Freq: Three times a day (TID) | ORAL | 0 refills | Status: AC

## 2023-06-30 MED ORDER — LACTATED RINGERS IV BOLUS
1000.0000 mL | Freq: Once | INTRAVENOUS | Status: AC
  Administered 2023-06-30: 1000 mL via INTRAVENOUS

## 2023-06-30 MED ORDER — FENTANYL CITRATE PF 50 MCG/ML IJ SOSY
50.0000 ug | PREFILLED_SYRINGE | Freq: Once | INTRAMUSCULAR | Status: DC
  Filled 2023-06-30: qty 1

## 2023-06-30 MED ORDER — IOHEXOL 300 MG/ML  SOLN
100.0000 mL | Freq: Once | INTRAMUSCULAR | Status: AC | PRN
  Administered 2023-06-30: 100 mL via INTRAVENOUS

## 2023-06-30 MED ORDER — ONDANSETRON HCL 4 MG/2ML IJ SOLN
4.0000 mg | Freq: Once | INTRAMUSCULAR | Status: DC
Start: 2023-06-30 — End: 2023-06-30
  Filled 2023-06-30: qty 2

## 2023-06-30 NOTE — Discharge Instructions (Addendum)
 Your CT scan shows there is some inflammation of your rectum.  This may be from inflammatory bowel disease.  Need to reestablish care with gastroenterology.  May take the Bentyl medication as needed for abdominal cramps and spasms.  You will be called if your stool studies are positive and you need further treatment.  Return to the ED with new or worsening symptoms.

## 2023-06-30 NOTE — ED Triage Notes (Addendum)
 Diarrhea x 4 days followed with nausea, and generalized abdominal cramps.   Denies vomiting.   Hx of IBS.

## 2023-06-30 NOTE — ED Provider Notes (Signed)
 [facility] AT [facility] Provider Note   CSN: [id number] Arrival date & time: 06/30/23  0103     History  Chief Complaint  Patient presents with   Diarrhea    Cesar Carlson is a 33 y.o. male.  Patient reports history of IBS here with intermittent diarrhea and abdominal cramps for the past 4 days.  Estimates he is going between 8-10 times daily with loose watery stools sometimes with blood streaks.  Toilet bowl does not turn red.  No blood with wiping.  No dizziness or lightheadedness.  Some lower abdominal cramping.  No fever.  Nausea but no vomiting.  No chest pain or shortness of breath.  No previous abdominal surgeries.  Reports history of IBS but has never had issues with diarrhea like this before.  No recent antibiotic use, sick contacts or travel. No previous abdominal surgeries except for rectal prolapse repair. Chart review shows there was suspicion for Crohn's disease several years ago but patient states he tested negative for Crohn's by biopsy. Also has gradual onset headache that is diffuse worsening over the past 4 days as well.  Denies thunderclap onset.  No focal weakness, numbness or tingling.  No difficulty breathing or difficulty swallowing  He last saw Dr. Leone Payor in 2018 and has since been seen in [facility]. Per [facility], November 2024: "The patient has a long history of GI issues. CT scan in 2013 was normal. EGD in 2019 showed grade 1 esophagitis. 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 He was given Xifaxan in the past with minimal improvement. He is also been tried on Linzess for constipation. He then started having diarrhea and was given colestipol as well as dicyclomine and CRP testing was negative. He underwent a sigmoidoscopy in 07/2022 which showed rectal prolapse and a polyp which was removed. Biopsies were negative. The patient then underwent robotic rectopexy with Dr. Roxan Hockey on 10/2022 and since then he has had  some constipation "   The history is provided by the patient.  Diarrhea Associated symptoms: abdominal pain and headaches   Associated symptoms: no arthralgias, no fever, no myalgias and no vomiting        Home Medications Prior to Admission medications   Medication Sig Start Date End Date Taking? Authorizing Provider  dicyclomine (BENTYL) 20 MG tablet Take 1 tablet (20 mg total) by mouth 4 (four) times daily -  before meals and at bedtime. 08/19/16   Iva Boop, MD  mesalamine (PENTASA) 500 MG CR capsule Take 2 capsules (1,000 mg total) by mouth 3 (three) times daily. 07/04/16   Iva Boop, MD      Allergies    Patient has no known allergies.    Review of Systems   Review of Systems  Constitutional:  Positive for activity change and appetite change. Negative for fever.  HENT:  Negative for congestion.   Respiratory:  Negative for cough, chest tightness and shortness of breath.   Cardiovascular:  Negative for chest pain.  Gastrointestinal:  Positive for abdominal pain, diarrhea and nausea. Negative for vomiting.  Genitourinary:  Negative for dysuria and hematuria.  Musculoskeletal:  Negative for arthralgias and myalgias.  Skin:  Negative for rash.  Neurological:  Positive for weakness and headaches. Negative for dizziness and light-headedness.   all other systems are negative except as noted in the HPI and PMH.    Physical Exam Updated Vital Signs BP (!) 139/93 (BP Location: Right Arm)   Pulse 62  Temp (!) 97.4 F (36.3 C)   Resp 19   Ht 5\' 8"  (1.727 m)   Wt 86.2 kg   SpO2 99%   BMI 28.89 kg/m  Physical Exam Vitals and nursing note reviewed.  Constitutional:      General: He is not in acute distress.    Appearance: He is well-developed.  HENT:     Head: Normocephalic and atraumatic.     Mouth/Throat:     Pharynx: No oropharyngeal exudate.  Eyes:     Conjunctiva/sclera: Conjunctivae normal.     Pupils: Pupils are equal, round, and reactive to light.   Neck:     Comments: No meningismus. Cardiovascular:     Rate and Rhythm: Normal rate and regular rhythm.     Heart sounds: Normal heart sounds. No murmur heard. Pulmonary:     Effort: Pulmonary effort is normal. No respiratory distress.     Breath sounds: Normal breath sounds.  Abdominal:     Palpations: Abdomen is soft.     Tenderness: There is abdominal tenderness. There is no guarding or rebound.     Comments: Left lower Quadrant tenderness, no guarding or rebound  Musculoskeletal:        General: No tenderness. Normal range of motion.     Cervical back: Normal range of motion and neck supple.  Skin:    General: Skin is warm.  Neurological:     Mental Status: He is alert and oriented to person, place, and time.     Cranial Nerves: No cranial nerve deficit.     Motor: No abnormal muscle tone.     Coordination: Coordination normal.     Comments:  5/5 strength throughout. CN 2-12 intact.Equal grip strength.   Psychiatric:        Behavior: Behavior normal.     ED Results / Procedures / Treatments   Labs (all labs ordered are listed, but only abnormal results are displayed) Labs Reviewed  COMPREHENSIVE METABOLIC PANEL WITH GFR - Abnormal; Notable for the following components:      Result Value   Creatinine, Ser 1.27 (*)    All other components within normal limits  CBC WITH DIFFERENTIAL/PLATELET - Abnormal; Notable for the following components:   WBC 2.9 (*)    Platelets 122 (*)    Neutro Abs 0.9 (*)    All other components within normal limits  C DIFFICILE QUICK SCREEN W PCR REFLEX    GASTROINTESTINAL PANEL BY PCR, STOOL (REPLACES STOOL CULTURE)  LIPASE, BLOOD  URINALYSIS, ROUTINE W REFLEX MICROSCOPIC    EKG None  Radiology CT ABDOMEN PELVIS W CONTRAST Result Date: 06/30/2023 CLINICAL DATA:  Diarrhea, nausea, abdominal pain EXAM: CT ABDOMEN AND PELVIS WITH CONTRAST TECHNIQUE: Multidetector CT imaging of the abdomen and pelvis was performed using the standard  protocol following bolus administration of intravenous contrast. RADIATION DOSE REDUCTION: This exam was performed according to the departmental dose-optimization program which includes automated exposure control, adjustment of the mA and/or kV according to patient size and/or use of iterative reconstruction technique. CONTRAST:  OMNIPAQUE IOHEXOL 300 MG/ML  SOLN COMPARISON:  CT 12/28/2012 FINDINGS: Lower chest: No acute abnormality. Hepatobiliary: Unremarkable liver. Normal gallbladder. No biliary dilation. Pancreas: Unremarkable. Spleen: Unremarkable. Adrenals/Urinary Tract: Normal adrenal glands. No urinary calculi or hydronephrosis. Bladder is unremarkable. Stomach/Bowel: Normal caliber large and small bowel. Rectal wall thickening with mucosal hyperenhancement and adjacent perirectal inflammatory fat stranding. The appendix is not visualized. No secondary signs of appendicitis. Stomach is within normal limits. Vascular/Lymphatic:  No significant vascular findings are present. No enlarged abdominal or pelvic lymph nodes. Reproductive: Unremarkable. Other: No abscess or free intraperitoneal air air. Musculoskeletal: No acute fracture. IMPRESSION: Inflamed rectum. Correlate for history of inflammatory bowel disease. Electronically Signed   By: Minerva Fester M.D.   On: 06/30/2023 02:55   CT Head Wo Contrast Result Date: 06/30/2023 CLINICAL DATA:  Sudden onset severe headache EXAM: CT HEAD WITHOUT CONTRAST TECHNIQUE: Contiguous axial images were obtained from the base of the skull through the vertex without intravenous contrast. RADIATION DOSE REDUCTION: This exam was performed according to the departmental dose-optimization program which includes automated exposure control, adjustment of the mA and/or kV according to patient size and/or use of iterative reconstruction technique. COMPARISON:  None Available. FINDINGS: Brain: No mass,hemorrhage or extra-axial collection. Normal appearance of the parenchyma  and CSF spaces. Vascular: No hyperdense vessel or unexpected vascular calcification. Skull: The visualized skull base, calvarium and extracranial soft tissues are normal. Sinuses/Orbits: Opacification of the left maxillary sinus. Other: None. IMPRESSION: 1. No acute intracranial abnormality. 2. Opacification of the left maxillary sinus. Electronically Signed   By: Deatra Robinson M.D.   On: 06/30/2023 02:37    Procedures Procedures    Medications Ordered in ED Medications  lactated ringers bolus 1,000 mL (has no administration in time range)  ondansetron (ZOFRAN) injection 4 mg (has no administration in time range)  fentaNYL (SUBLIMAZE) injection 50 mcg (has no administration in time range)    ED Course/ Medical Decision Making/ A&P                                 Medical Decision Making Amount and/or Complexity of Data Reviewed Labs: ordered. Decision-making details documented in ED Course. Radiology: ordered and independent interpretation performed. Decision-making details documented in ED Course. ECG/medicine tests: ordered and independent interpretation performed. Decision-making details documented in ED Course.  Risk Prescription drug management.   4 days of diarrhea with lower abdominal pain.   Abdomen soft without peritoneal signs.  Stable vital signs.  No fever.  Will hydrate, treat symptoms, check labs  Labs show stable leukopenia and thrombocytopenia.  Minimal creatinine elevation.  Will hydrate.  LFTs and lipase are normal.  Patient aware of thrombocytopenia and leukopenia.  States he has seen a hematologist and had a bone marrow biopsy but results were nonrevealing. Values are stable today.  CT scan shows rectal inflammation with concern for IBD.  No acute surgical pathology noted.  Patient tolerating p.o. and ambulatory.  No evidence of significant dehydration.  He was given IV fluids. Attempting to provide a stool sample.  No significant anemia.  Low suspicion  for significant GI bleed.  Needs to reestablish care with gastroenterology.  Some concern for underlying inflammatory bowel disease.  Tolerating p.o.  Stool studies pending.  Abdomen soft on recheck.  Discussed close follow-up with gastroenterology.  Stool studies pending.  Will hold antibiotics at this time given pending stool studies. Return precautions discussed        Final Clinical Impression(s) / ED Diagnoses Final diagnoses:  Diarrhea, unspecified type    Rx / DC Orders ED Discharge Orders     None         Tianna Baus, Jeannett Senior, MD 06/30/23 [phone]

## 2023-07-01 LAB — GASTROINTESTINAL PANEL BY PCR, STOOL (REPLACES STOOL CULTURE)
